# Patient Record
Sex: Male | Born: 1935
Health system: Southern US, Community
[De-identification: ages and names within clinical notes are randomized; demographics above are authoritative.]

---

## 2015-11-16 ENCOUNTER — Emergency Department (HOSPITAL_COMMUNITY)
Admission: EM | Admit: 2015-11-16 | Discharge: 2015-11-16 | Discharge status: Absent without leave | Payer: Self-pay | Attending: Emergency Medicine | Admitting: Emergency Medicine

## 2015-11-16 MED ORDER — SODIUM CHLORIDE 0.9 % IV BOLUS (SEPSIS): 1000.0000 mL | Freq: Once | INTRAVENOUS | Status: DC

## 2015-11-16 MED ORDER — LORAZEPAM 2 MG/ML IJ SOLN: 2.0000 mg | Freq: Once | INTRAMUSCULAR | Status: DC
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
## 2021-05-04 ENCOUNTER — Emergency Department (HOSPITAL_COMMUNITY)
Admission: EM | Admit: 2021-05-04 | Discharge: 2021-05-04 | Disposition: A | Discharge status: Home / Self Care | Payer: Self-pay | Attending: Emergency Medicine | Admitting: Emergency Medicine

## 2021-05-04 ENCOUNTER — Emergency Department (HOSPITAL_COMMUNITY): Payer: Self-pay

## 2021-05-04 DIAGNOSIS — S59901A Unspecified injury of right elbow, initial encounter: Secondary | ICD-10-CM | POA: Insufficient documentation

## 2021-05-04 DIAGNOSIS — X58XXXA Exposure to other specified factors, initial encounter: Secondary | ICD-10-CM | POA: Insufficient documentation

## 2021-05-04 DIAGNOSIS — M25521 Pain in right elbow: Secondary | ICD-10-CM | POA: Insufficient documentation

## 2021-05-04 MED ORDER — IBUPROFEN 800 MG PO TABS
800.0000 mg | ORAL_TABLET | Freq: Once | ORAL | Status: AC
  Administered 2021-05-04: 800 mg via ORAL
  Filled 2021-05-04: qty 1

## 2021-05-04 MED ORDER — HYDROCODONE-ACETAMINOPHEN 5-325 MG PO TABS: 2.0000 | ORAL_TABLET | Freq: Once | ORAL | Status: DC

## 2021-05-04 MED ORDER — IBUPROFEN 800 MG PO TABS: 800.0000 mg | ORAL_TABLET | Freq: Three times a day (TID) | ORAL | 0 refills | Status: AC

## 2021-05-04 NOTE — ED Notes (Signed)
Patient discharged by EDP
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
